# Patient Record
Sex: Male | Born: 1985 | Race: White | Hispanic: Yes | Marital: Single | Smoking: Never smoker
Health system: Southern US, Community
[De-identification: ages and names within clinical notes are randomized; demographics above are authoritative.]

---

## 2013-08-15 ENCOUNTER — Encounter (HOSPITAL_BASED_OUTPATIENT_CLINIC_OR_DEPARTMENT_OTHER): Payer: Self-pay | Admitting: Emergency Medicine

## 2013-08-15 ENCOUNTER — Emergency Department (HOSPITAL_BASED_OUTPATIENT_CLINIC_OR_DEPARTMENT_OTHER)
Admission: EM | Admit: 2013-08-15 | Discharge: 2013-08-15 | Disposition: A | Discharge status: Home / Self Care | Payer: BC Managed Care – PPO | Attending: Emergency Medicine | Admitting: Emergency Medicine

## 2013-08-15 DIAGNOSIS — Y939 Activity, unspecified: Secondary | ICD-10-CM | POA: Insufficient documentation

## 2013-08-15 DIAGNOSIS — W57XXXA Bitten or stung by nonvenomous insect and other nonvenomous arthropods, initial encounter: Secondary | ICD-10-CM | POA: Insufficient documentation

## 2013-08-15 DIAGNOSIS — S40269A Insect bite (nonvenomous) of unspecified shoulder, initial encounter: Secondary | ICD-10-CM | POA: Insufficient documentation

## 2013-08-15 DIAGNOSIS — S90569A Insect bite (nonvenomous), unspecified ankle, initial encounter: Secondary | ICD-10-CM | POA: Insufficient documentation

## 2013-08-15 DIAGNOSIS — Y929 Unspecified place or not applicable: Secondary | ICD-10-CM | POA: Insufficient documentation

## 2013-08-15 MED ORDER — PREDNISONE 10 MG PO TABS: ORAL_TABLET | ORAL | Status: AC

## 2013-08-15 NOTE — ED Provider Notes (Signed)
CSN: [id number]     Arrival date & time 08/15/13  2118 History   First MD Initiated Contact with Patient 08/15/13 2154     Chief Complaint  Patient presents with  . Rash     (Consider location/radiation/quality/duration/timing/severity/associated sxs/prior Treatment) Patient is a 28 y.o. male presenting with rash. The history is provided by the patient. No language interpreter was used.  Rash Location:  Shoulder/arm and leg Shoulder/arm rash location:  L arm and R arm Leg rash location:  L leg and R leg Quality: painful, redness and swelling   Pain details:    Quality:  Aching   Severity:  Moderate   Timing:  Constant   Progression:  Worsening Severity:  Mild Progression:  Worsening Chronicity:  New Relieved by:  Nothing Worsened by:  Nothing tried Ineffective treatments:  None tried   History reviewed. No pertinent past medical history. History reviewed. No pertinent past surgical history. No family history on file. History  Substance Use Topics  . Smoking status: Never Smoker   . Smokeless tobacco: Not on file  . Alcohol Use: Yes    Review of Systems  Skin: Positive for rash.  All other systems reviewed and are negative.     Allergies  Review of patient's allergies indicates no known allergies.  Home Medications  No current outpatient prescriptions on file. BP 128/81  Pulse 60  Temp(Src) 98.1 F (36.7 C) (Oral)  Resp 18  Ht 5\' 7"  (1.702 m)  Wt 191 lb (86.637 kg)  BMI 29.91 kg/m2  SpO2 99% Physical Exam  Nursing note and vitals reviewed. Constitutional: He is oriented to person, place, and time. He appears well-developed and well-nourished.  HENT:  Head: Normocephalic.  Right Ear: External ear normal.  Left Ear: External ear normal.  Eyes: Pupils are equal, round, and reactive to light.  Neck: Normal range of motion. Neck supple.  Cardiovascular: Normal rate.   Pulmonary/Chest: Effort normal.  Abdominal: Soft.  Musculoskeletal: Normal range of  motion.  Neurological: He is alert and oriented to person, place, and time. He has normal reflexes.  Skin: There is erythema.  Multiple erythematous bites,  Arms and legs,  Psychiatric: He has a normal mood and affect.    ED Course  Procedures (including critical care time) Labs Review Labs Reviewed - No data to display Imaging Review No results found.   EKG Interpretation None      MDM   Final diagnoses:  Insect bites    Prednisone taper    Elson AreasLeslie K Sofia, PA-C 08/15/13 2205

## 2013-08-15 NOTE — ED Notes (Signed)
Rash on his arms face and legs since spending the weekend at the beach.

## 2013-08-15 NOTE — ED Provider Notes (Signed)
Medical screening examination/treatment/procedure(s) were performed by non-physician practitioner and as supervising physician I was immediately available for consultation/collaboration.   EKG Interpretation None        William Jance Siek, MD 08/15/13 2254 

## 2013-08-15 NOTE — Discharge Instructions (Signed)
Bedbugs °Bedbugs are tiny bugs that live in and around beds. During the day, they hide in mattresses and other places near beds. They come out at night and bite people lying in bed. They need blood to live and grow. Bedbugs can be found in beds anywhere. Usually, they are found in places where many people come and go (hotels, shelters, hospitals). It does not matter whether the place is dirty or clean. °Getting bitten by bedbugs rarely causes a medical problem. The biggest problem can be getting rid of them.  This often takes the work of a pest control expert. °CAUSES °· Less use of pesticides. Bedbugs were common before the 1950s. Then, strong pesticides such as DDT nearly wiped them out. Today, these pesticides are not used because they harm the environment and can cause health problems. °· More travel. Besides mattresses, bedbugs can also live in clothing and luggage. They can come along as people travel from place to place. Bedbugs are more common in certain parts of the world. When people travel to those areas, the bugs can come home with them. °· Presence of birds and bats. Bedbugs often infest birds and bats. If you have these animals in or near your home, bedbugs may infest your house, too. °SYMPTOMS °It does not hurt to be bitten by a bedbug. You will probably not wake up when you are bitten. Bedbugs usually bite areas of the skin that are not covered. Symptoms may show when you wake up, or they may take a day or more to show up. Symptoms may include: °· Small red bumps on the skin. These might be lined up in a row or clustered in a group. °· A darker red dot in the middle of red bumps. °· Blisters on the skin. There may be swelling and very bad itching. These may be signs of an allergic reaction. This does not happen often. °DIAGNOSIS °Bedbug bites might look and feel like other types of insect bites. The bugs do not stay on the body like ticks or lice. They bite, drop off, and crawl away to hide. Your  caregiver will probably: °· Ask about your symptoms. °· Ask about your recent activities and travel. °· Check your skin for bedbug bites. °· Ask you to check at home for signs of bedbugs. You should look for: °· Spots or stains on the bed or nearby. This could be from bedbugs that were crushed or from their eggs or waste. °· Bedbugs themselves. They are reddish-brown, oval, and flat. They do not fly. They are about the size of an apple seed. °· Places to look for bedbugs include: °· Beds. Check mattresses, headboards, box springs, and bed frames. °· On drapes and curtains near the bed. °· Under carpeting in the bedroom. °· Behind electrical outlets. °· Behind any wallpaper that is peeling. °· Inside luggage. °TREATMENT °Most bedbug bites do not need treatment. They usually go away on their own in a few days. The bites are not dangerous. However, treatment may be needed if you have scratched so much that your skin has become infected. You may also need treatment if you are allergic to bedbug bites. Treatment options include: °· A drug that stops swelling and itching (corticosteroid). Usually, a cream is rubbed on the skin. If you have a bad rash, you may be given a corticosteroid pill. °· Oral antihistamines. These are pills to help control itching. °· Antibiotic medicines. An antibiotic may be prescribed for infected skin. °HOME CARE INSTRUCTIONS  °·   Take any medicine prescribed by your caregiver for your bites. Follow the directions carefully. °· Consider wearing pajamas with long sleeves and pant legs. °· Your bedroom may need to be treated. A pest control expert should make sure the bedbugs are gone. You may need to throw away mattresses or luggage. Ask the pest control expert what you can do to keep the bedbugs from coming back. Common suggestions include: °· Putting a plastic cover over your mattress. °· Washing and drying your clothes and bedding in hot water and a hot dryer. The temperature should be hotter  than 120° F (48.9° C). Bedbugs are killed by high temperatures. °· Vacuuming carefully all around your bed. Vacuum in all cracks and crevices where the bugs might hide. Do this often. °· Carefully checking all used furniture, bedding, or clothes that you bring into your house. °· Eliminating bird nests and bat roosts. °· If you get bedbug bites when traveling, check all your possessions carefully before bringing them into your house. If you find any bugs on clothes or in your luggage, consider throwing those items away. °SEEK MEDICAL CARE IF: °· You have red bug bites that keep coming back. °· You have red bug bites that itch badly. °· You have bug bites that cause a skin rash. °· You have scratch marks that are red and sore. °SEEK IMMEDIATE MEDICAL CARE IF: °You have a fever. °Document Released: 05/28/2010 Document Revised: 07/18/2011 Document Reviewed: 05/28/2010 °ExitCare® Patient Information ©2014 [employer]. ° °Insect Bite °Mosquitoes, flies, fleas, bedbugs, and many other insects can bite. Insect bites are different from insect stings. A sting is when venom is injected into the skin. Some insect bites can transmit infectious diseases. °SYMPTOMS  °Insect bites usually turn red, swell, and itch for 2 to 4 days. They often go away on their own. °TREATMENT  °Your caregiver may prescribe antibiotic medicines if a bacterial infection develops in the bite. °HOME CARE INSTRUCTIONS °· Do not scratch the bite area. °· Keep the bite area clean and dry. Wash the bite area thoroughly with soap and water. °· Put ice or cool compresses on the bite area. °· Put ice in a plastic bag. °· Place a towel between your skin and the bag. °· Leave the ice on for 20 minutes, 4 times a day for the first 2 to 3 days, or as directed. °· You may apply a baking soda paste, cortisone cream, or calamine lotion to the bite area as directed by your caregiver. This can help reduce itching and swelling. °· Only take over-the-counter or  prescription medicines as directed by your caregiver. °· If you are given antibiotics, take them as directed. Finish them even if you start to feel better. °You may need a tetanus shot if: °· You cannot remember when you had your last tetanus shot. °· You have never had a tetanus shot. °· The injury broke your skin. °If you get a tetanus shot, your arm may swell, get red, and feel warm to the touch. This is common and not a problem. If you need a tetanus shot and you choose not to have one, there is a rare chance of getting tetanus. Sickness from tetanus can be serious. °SEEK IMMEDIATE MEDICAL CARE IF:  °· You have increased pain, redness, or swelling in the bite area. °· You see a red line on the skin coming from the bite. °· You have a fever. °· You have joint pain. °· You have a headache or neck   pain. °· You have unusual weakness. °· You have a rash. °· You have chest pain or shortness of breath. °· You have abdominal pain, nausea, or vomiting. °· You feel unusually tired or sleepy. °MAKE SURE YOU:  °· Understand these instructions. °· Will watch your condition. °· Will get help right away if you are not doing well or get worse. °Document Released: 06/02/2004 Document Revised: 07/18/2011 Document Reviewed: 11/24/2010 °ExitCare® Patient Information ©2014 [employer]. ° °

## 2014-06-27 ENCOUNTER — Emergency Department (HOSPITAL_COMMUNITY)
Admission: EM | Admit: 2014-06-27 | Discharge: 2014-06-27 | Disposition: A | Discharge status: Home / Self Care | Payer: 59 | Attending: Emergency Medicine | Admitting: Emergency Medicine

## 2014-06-27 ENCOUNTER — Emergency Department (HOSPITAL_COMMUNITY): Payer: 59

## 2014-06-27 ENCOUNTER — Encounter (HOSPITAL_COMMUNITY): Payer: Self-pay | Admitting: Emergency Medicine

## 2014-06-27 DIAGNOSIS — F10129 Alcohol abuse with intoxication, unspecified: Secondary | ICD-10-CM | POA: Diagnosis present

## 2014-06-27 DIAGNOSIS — Y907 Blood alcohol level of 200-239 mg/100 ml: Secondary | ICD-10-CM | POA: Insufficient documentation

## 2014-06-27 DIAGNOSIS — F10120 Alcohol abuse with intoxication, uncomplicated: Secondary | ICD-10-CM | POA: Insufficient documentation

## 2014-06-27 DIAGNOSIS — Z79899 Other long term (current) drug therapy: Secondary | ICD-10-CM | POA: Diagnosis not present

## 2014-06-27 DIAGNOSIS — F10920 Alcohol use, unspecified with intoxication, uncomplicated: Secondary | ICD-10-CM

## 2014-06-27 LAB — CBG MONITORING, ED: Glucose-Capillary: 181 mg/dL — ABNORMAL HIGH (ref 70–99)

## 2014-06-27 LAB — RAPID URINE DRUG SCREEN, HOSP PERFORMED
Amphetamines: NOT DETECTED
BARBITURATES: NOT DETECTED
Benzodiazepines: NOT DETECTED
Cocaine: NOT DETECTED
Opiates: NOT DETECTED
Tetrahydrocannabinol: NOT DETECTED

## 2014-06-27 LAB — CBC WITH DIFFERENTIAL/PLATELET
BASOS PCT: 0 % (ref 0–1)
Basophils Absolute: 0 10*3/uL (ref 0.0–0.1)
EOS ABS: 0 10*3/uL (ref 0.0–0.7)
EOS PCT: 0 % (ref 0–5)
HCT: 41.3 % (ref 39.0–52.0)
Hemoglobin: 14.5 g/dL (ref 13.0–17.0)
LYMPHS ABS: 2.7 10*3/uL (ref 0.7–4.0)
Lymphocytes Relative: 43 % (ref 12–46)
MCH: 31.3 pg (ref 26.0–34.0)
MCHC: 35.1 g/dL (ref 30.0–36.0)
MCV: 89.2 fL (ref 78.0–100.0)
Monocytes Absolute: 0.4 10*3/uL (ref 0.1–1.0)
Monocytes Relative: 6 % (ref 3–12)
NEUTROS PCT: 51 % (ref 43–77)
Neutro Abs: 3.3 10*3/uL (ref 1.7–7.7)
PLATELETS: 202 10*3/uL (ref 150–400)
RBC: 4.63 MIL/uL (ref 4.22–5.81)
RDW: 12.6 % (ref 11.5–15.5)
WBC: 6.3 10*3/uL (ref 4.0–10.5)

## 2014-06-27 LAB — I-STAT CHEM 8, ED
BUN: 15 mg/dL (ref 6–23)
Calcium, Ion: 1.14 mmol/L (ref 1.12–1.23)
Chloride: 99 mmol/L (ref 96–112)
Creatinine, Ser: 1.1 mg/dL (ref 0.50–1.35)
Glucose, Bld: 187 mg/dL — ABNORMAL HIGH (ref 70–99)
HCT: 46 % (ref 39.0–52.0)
HEMOGLOBIN: 15.6 g/dL (ref 13.0–17.0)
POTASSIUM: 3.2 mmol/L — AB (ref 3.5–5.1)
SODIUM: 140 mmol/L (ref 135–145)
TCO2: 20 mmol/L (ref 0–100)

## 2014-06-27 LAB — ETHANOL: Alcohol, Ethyl (B): 212 mg/dL — ABNORMAL HIGH (ref 0–9)

## 2014-06-27 LAB — SALICYLATE LEVEL: Salicylate Lvl: <4 mg/dL (ref 2.8–20.0)

## 2014-06-27 LAB — ACETAMINOPHEN LEVEL

## 2014-06-27 MED ORDER — SODIUM CHLORIDE 0.9 % IV BOLUS (SEPSIS)
500.0000 mL | Freq: Once | INTRAVENOUS | Status: AC
  Administered 2014-06-27: 500 mL via INTRAVENOUS

## 2014-06-27 MED ORDER — ONDANSETRON HCL 4 MG/2ML IJ SOLN
4.0000 mg | Freq: Once | INTRAMUSCULAR | Status: AC
Start: 2014-06-27 — End: 2014-06-27
  Administered 2014-06-27: 4 mg via INTRAVENOUS
  Filled 2014-06-27: qty 2

## 2014-06-27 MED ORDER — SODIUM CHLORIDE 0.9 % IV SOLN
Freq: Once | INTRAVENOUS | Status: AC
  Administered 2014-06-27: 05:00:00 via INTRAVENOUS

## 2014-06-27 MED ORDER — ETOMIDATE 2 MG/ML IV SOLN
INTRAVENOUS | Status: AC
  Filled 2014-06-27: qty 20

## 2014-06-27 MED ORDER — SUCCINYLCHOLINE CHLORIDE 20 MG/ML IJ SOLN
INTRAMUSCULAR | Status: AC
  Filled 2014-06-27: qty 1

## 2014-06-27 MED ORDER — SODIUM CHLORIDE 0.9 % IV BOLUS (SEPSIS)
1000.0000 mL | Freq: Once | INTRAVENOUS | Status: AC
  Administered 2014-06-27: 1000 mL via INTRAVENOUS

## 2014-06-27 MED ORDER — ONDANSETRON 8 MG PO TBDP: ORAL_TABLET | ORAL | Status: AC

## 2014-06-27 MED ORDER — ROCURONIUM BROMIDE 50 MG/5ML IV SOLN
INTRAVENOUS | Status: AC
  Filled 2014-06-27: qty 2

## 2014-06-27 MED ORDER — NALOXONE HCL 0.4 MG/ML IJ SOLN
0.4000 mg | Freq: Once | INTRAMUSCULAR | Status: AC
  Administered 2014-06-27: 0.4 mg via INTRAVENOUS
  Filled 2014-06-27: qty 1

## 2014-06-27 MED ORDER — LIDOCAINE HCL (CARDIAC) 20 MG/ML IV SOLN
INTRAVENOUS | Status: AC
  Filled 2014-06-27: qty 5

## 2014-06-27 NOTE — ED Notes (Signed)
Family at bedside to get all belongings.

## 2014-06-27 NOTE — ED Provider Notes (Signed)
Pt is clinically sober now.  Dc home. Calling for ride. Vitals normal  Chad CoKevin M Shalana Jardin, MD 06/27/14 [phone]

## 2014-06-27 NOTE — ED Notes (Addendum)
Pt a&ox4. Answering all questions appropriately.  Respirations even and unlabored, bilateral symmetrical rise and fall of chest. Skin warm and dry. In no acute distress. Denies needs.    On the phone to call for a ride.

## 2014-06-27 NOTE — ED Provider Notes (Signed)
CSN: [id number]     Arrival date & time 06/27/14  0404 History   First MD Initiated Contact with Patient 06/27/14 [phone]     Chief Complaint  Patient presents with  . Alcohol Intoxication     (Consider location/radiation/quality/duration/timing/severity/associated sxs/prior Treatment) Patient is a 29 y.o. male presenting with intoxication. The history is provided by the police. The history is limited by the condition of the patient.  Alcohol Intoxication This is a new problem. Episode onset: unknown. The problem occurs constantly. The problem has not changed since onset.Nothing aggravates the symptoms. Nothing relieves the symptoms. He has tried nothing for the symptoms. The treatment provided no relief.    History reviewed. No pertinent past medical history. History reviewed. No pertinent past surgical history. History reviewed. No pertinent family history. History  Substance Use Topics  . Smoking status: Never Smoker   . Smokeless tobacco: Not on file  . Alcohol Use: Yes    Review of Systems  Unable to perform ROS     Allergies  Review of patient's allergies indicates no known allergies.  Home Medications   Prior to Admission medications   Medication Sig Start Date End Date Taking? Authorizing Provider  Multiple Vitamin (MULTIVITAMIN WITH MINERALS) TABS tablet Take 1 tablet by mouth daily.   Yes Historical Provider, MD  predniSONE (DELTASONE) 10 MG tablet 6,5,4,3,2,1 taper Patient not taking: Reported on 06/27/2014 08/15/13   Lonia Skinner Sofia, PA-C   BP 107/66 mmHg  Pulse 78  Temp(Src) 97.5 F (36.4 C) (Oral)  Resp 14  SpO2 100% Physical Exam  Constitutional: He appears well-developed and well-nourished.  HENT:  Head: Normocephalic and atraumatic.  Mouth/Throat: Oropharynx is clear and moist.  Eyes: Conjunctivae and EOM are normal. Pupils are equal, round, and reactive to light.  Neck: Normal range of motion. Neck supple.  Cardiovascular: Normal rate, regular rhythm  and intact distal pulses.   Pulmonary/Chest: Effort normal and breath sounds normal. No respiratory distress. He has no wheezes. He has no rales.  Abdominal: Soft. Bowel sounds are normal. There is no tenderness. There is no rebound and no guarding.  Musculoskeletal: Normal range of motion.  Neurological: He has normal reflexes.  Now AO3  Skin: Skin is warm and dry. He is not diaphoretic.    ED Course  Procedures (including critical care time) Labs Review Labs Reviewed  ETHANOL - Abnormal; Notable for the following:    Alcohol, Ethyl (B) 212 (*)    All other components within normal limits  ACETAMINOPHEN LEVEL - Abnormal; Notable for the following:    Acetaminophen (Tylenol), Serum <10.0 (*)    All other components within normal limits  I-STAT CHEM 8, ED - Abnormal; Notable for the following:    Potassium 3.2 (*)    Glucose, Bld 187 (*)    All other components within normal limits  CBG MONITORING, ED - Abnormal; Notable for the following:    Glucose-Capillary 181 (*)    All other components within normal limits  CBC WITH DIFFERENTIAL/PLATELET  SALICYLATE LEVEL  URINE RAPID DRUG SCREEN (HOSP PERFORMED)    Imaging Review Dg Chest Portable 1 View  06/27/2014   CLINICAL DATA:  Alcohol intoxication  EXAM: PORTABLE CHEST - 1 VIEW  COMPARISON:  None.  FINDINGS: The heart size and mediastinal contours are within normal limits. Both lungs are clear. The visualized skeletal structures are unremarkable.  IMPRESSION: No active disease.   Electronically Signed   By: Ellery Plunk M.D.   On: 06/27/2014 04:55  EKG Interpretation   Date/Time:  Friday June 27 2014 04:31:41 EST Ventricular Rate:  81 PR Interval:  162 QRS Duration: 106 QT Interval:  380 QTC Calculation: 441 R Axis:   52 Text Interpretation:  Sinus rhythm Confirmed by [facility]  [facility], Kareemah Grounds  ([id number]) on 06/27/2014 5:19:42 AM      MDM   Final diagnoses:  None    Medications  ondansetron (ZOFRAN)  injection 4 mg (not administered)  0.9 %  sodium chloride infusion ( Intravenous Stopped 06/27/14 0617)  naloxone [facility](NARCAN) injection 0.4 mg (0.4 mg Intravenous Given 06/27/14 0522)  sodium chloride 0.9 % bolus 500 mL (0 mLs Intravenous Stopped 06/27/14 0719)    Sober up.  Awake but not yet tolerating POs will need to PO challenge and ambulate prior to d/c.      Jasmine AweApril K Averee Harb-Rasch, MD 06/27/14 [phone]

## 2014-06-27 NOTE — Discharge Instructions (Signed)
Alcohol Intoxication °Alcohol intoxication occurs when you drink enough alcohol that it affects your ability to function. It can be mild or very severe. Drinking a lot of alcohol in a short time is called binge drinking. This can be very harmful. Drinking alcohol can also be more dangerous if you are taking medicines or other drugs. Some of the effects caused by alcohol may include: °· Loss of coordination. °· Changes in mood and behavior. °· Unclear thinking. °· Trouble talking (slurred speech). °· Throwing up (vomiting). °· Confusion. °· Slowed breathing. °· Twitching and shaking (seizures). °· Loss of consciousness. °HOME CARE °· Do not drive after drinking alcohol. °· Drink enough water and fluids to keep your pee (urine) clear or pale yellow. Avoid caffeine. °· Only take medicine as told by your doctor. °GET HELP IF: °· You throw up (vomit) many times. °· You do not feel better after a few days. °· You frequently have alcohol intoxication. Your doctor can help decide if you should see a substance use treatment counselor. °GET HELP RIGHT AWAY IF: °· You become shaky when you stop drinking. °· You have twitching and shaking. °· You throw up blood. It may look bright red or like coffee grounds. °· You notice blood in your poop (bowel movements). °· You become lightheaded or pass out (faint). °MAKE SURE YOU:  °· Understand these instructions. °· Will watch your condition. °· Will get help right away if you are not doing well or get worse. °Document Released: 10/12/2007 Document Revised: 12/26/2012 Document Reviewed: 09/28/2012 °ExitCare® Patient Information ©2015 [employer]. This information is not intended to replace advice given to you by your health care provider. Make sure you discuss any questions you have with your health care provider. ° °

## 2014-06-27 NOTE — ED Notes (Signed)
Pt tolerated PO fluids

## 2014-06-27 NOTE — ED Notes (Signed)
Pt. Moved to Res B, unable to obtain information .

## 2014-06-27 NOTE — ED Notes (Signed)
Pt. On monitor. 
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
## 2014-06-27 NOTE — ED Notes (Signed)
MD at bedside. 

## 2016-02-23 IMAGING — CT CT HEAD W/O CM
2 series · 17 of 30 positions shown, 20 images · non-contrast
Comparison: None.

CLINICAL DATA: Alcohol intoxication

EXAM:
CT HEAD WITHOUT CONTRAST
TECHNIQUE: Contiguous axial images were obtained from the base of the skull
through the vertex without intravenous contrast.

[Series 2: head w/o · axial · non-contrast · 0.45mm/px · z∈[-108,+12]mm · 9 of 31 slices shown, 12 images]
[im 4/31  brain]
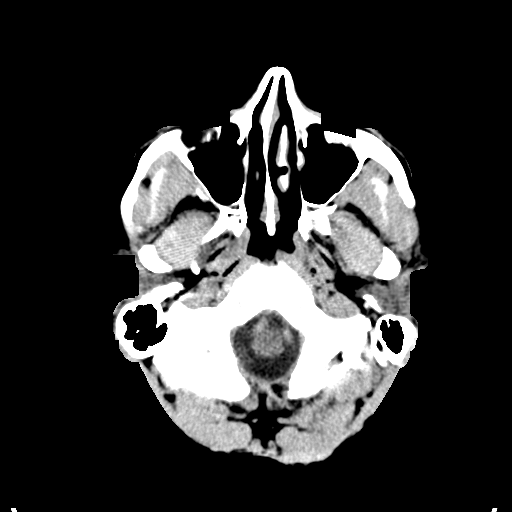
[im 4/31  bone]
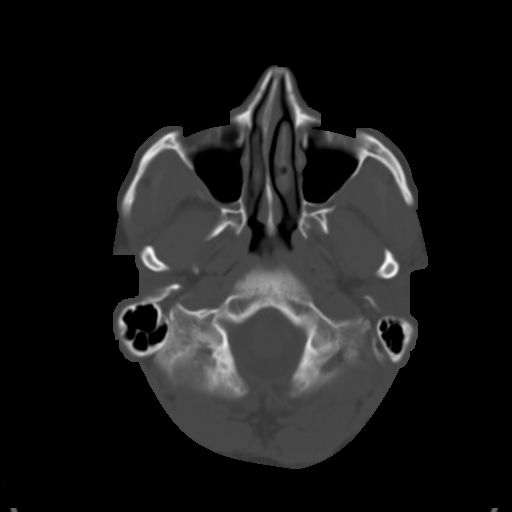
[im 7/31  brain]
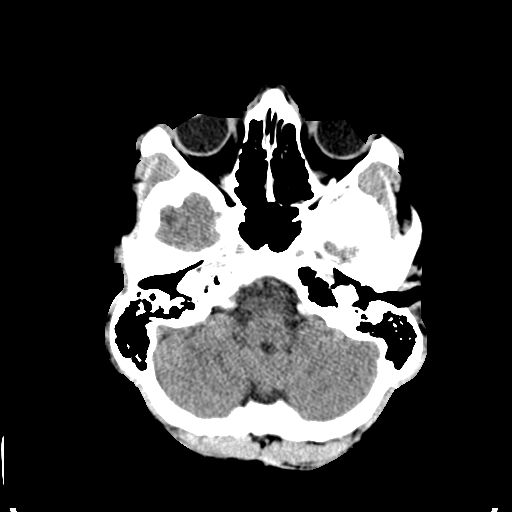
[im 10/31  brain]
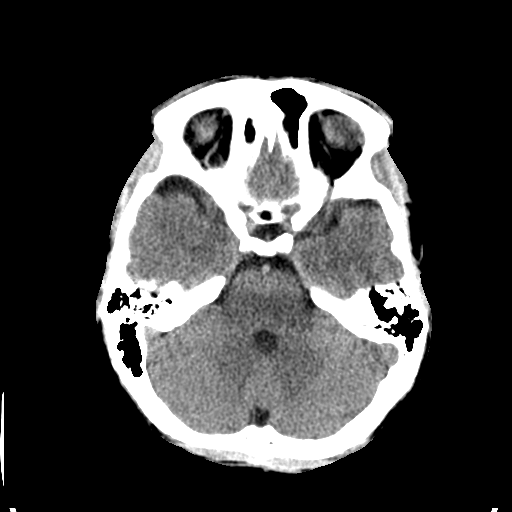
[im 13/31  brain]
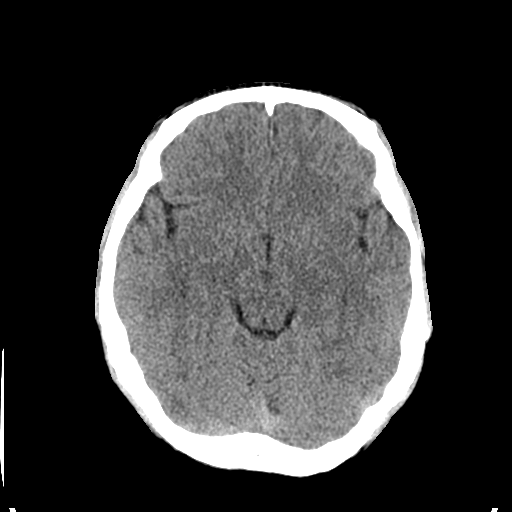
[im 16/31  brain]
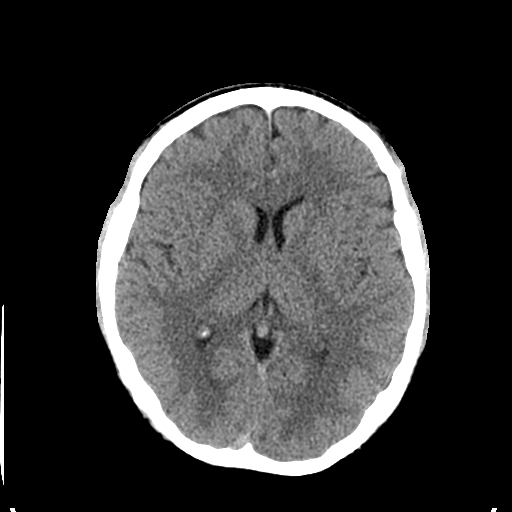
[im 16/31  bone]
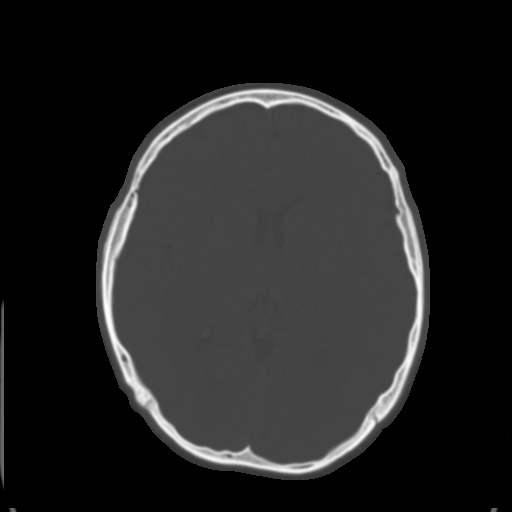
[im 19/31  brain]
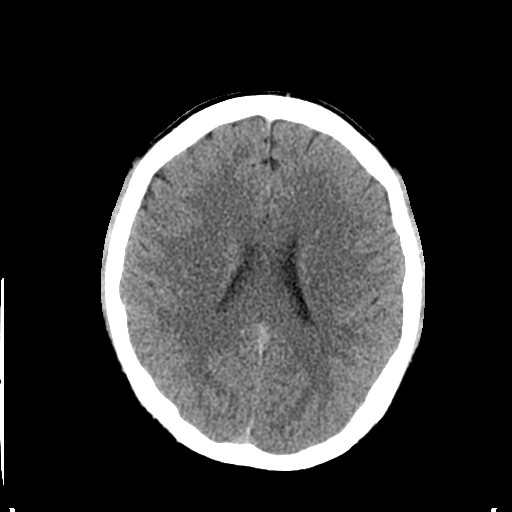
[im 22/31  brain]
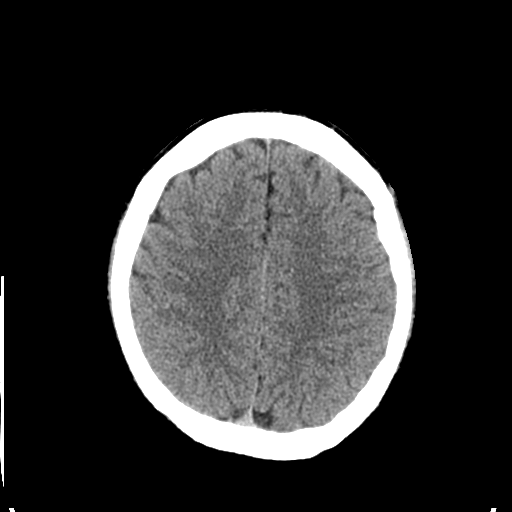
[im 25/31  brain]
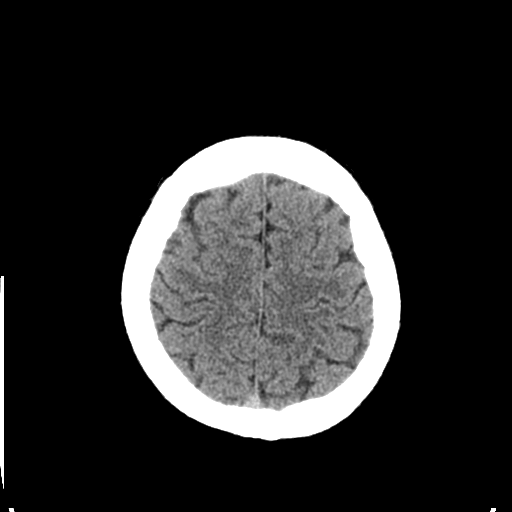
[im 28/31  brain]
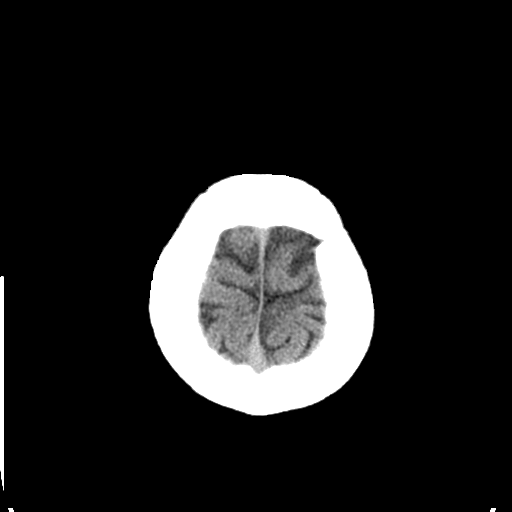
[im 28/31  bone]
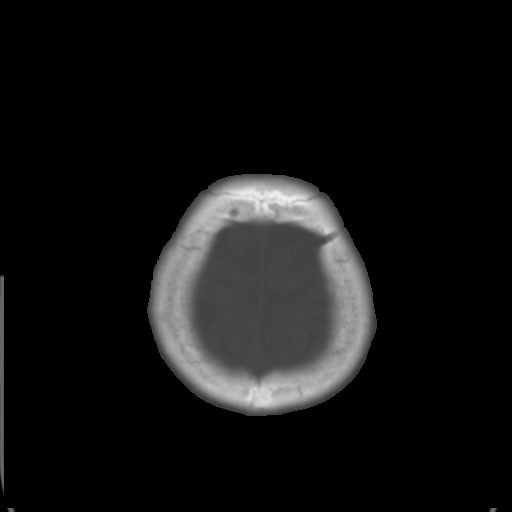

[Series 3: bone windows · axial · 0.45mm/px · z∈[-108,+12]mm · 8 of 52 slices shown]
[im 6/52  bone]
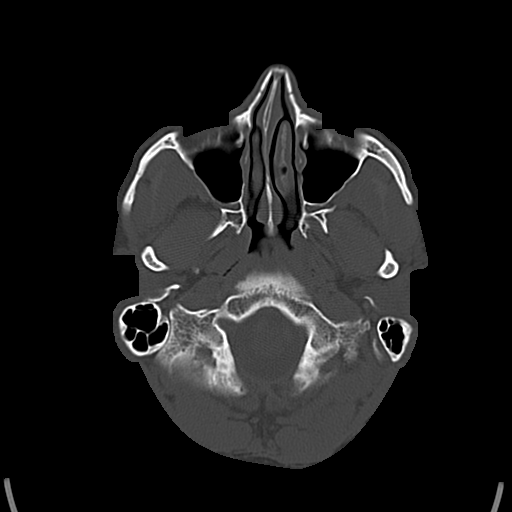
[im 12/52  bone]
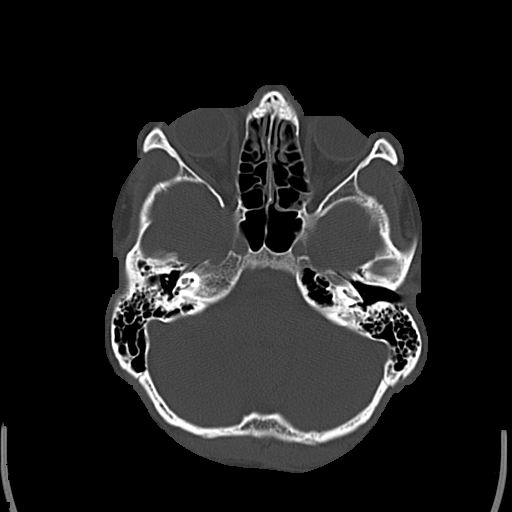
[im 18/52  bone]
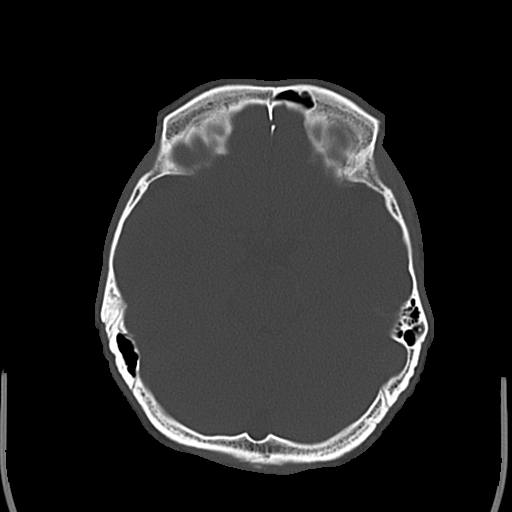
[im 23/52  bone]
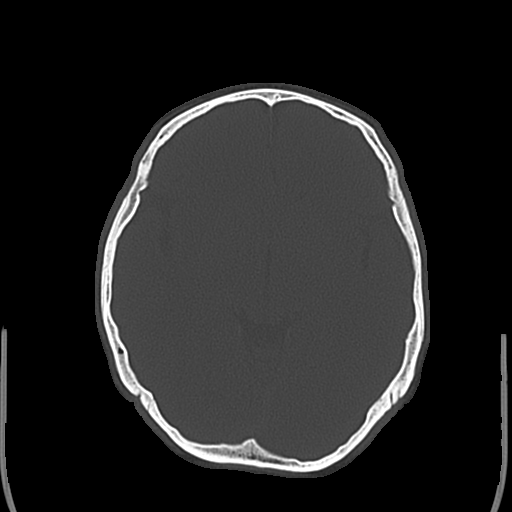
[im 29/52  bone]
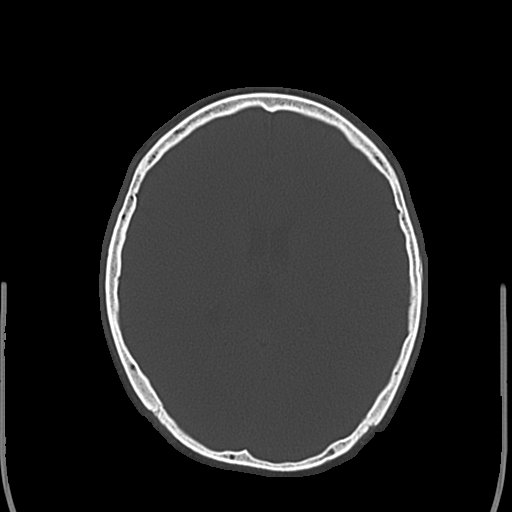
[im 35/52  bone]
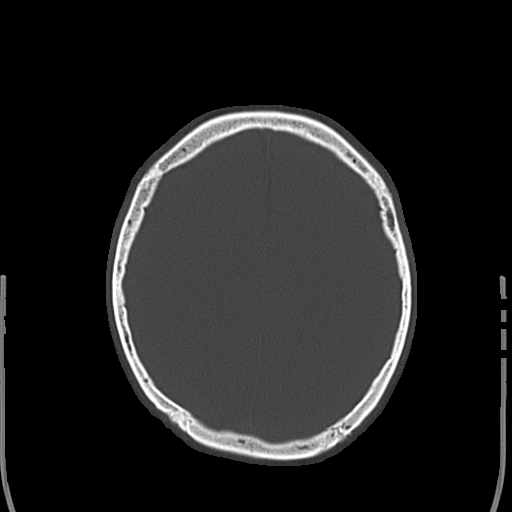
[im 40/52  bone]
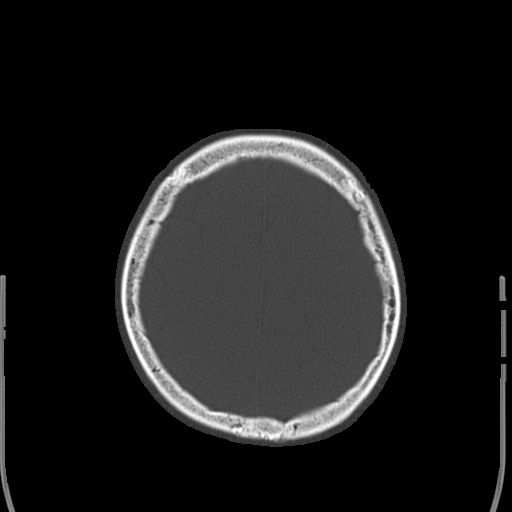
[im 46/52  bone]
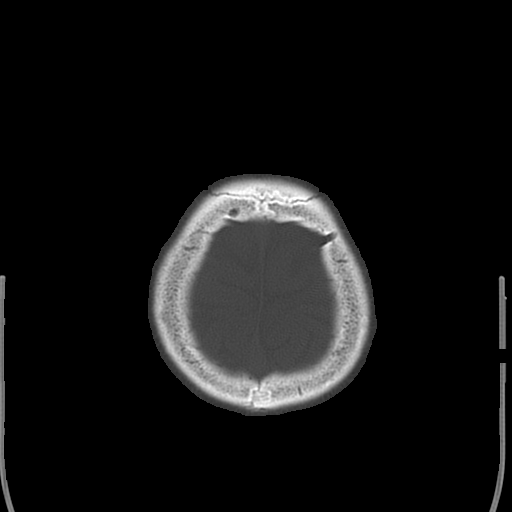

[17 of 30 positions shown; findings below may reference images not displayed]

FINDINGS: There is no intracranial hemorrhage, mass or evidence of acute
infarction. The brain and CSF spaces appear unremarkable.

The bony structures are intact. The visible portions of the
paranasal sinuses are clear.
IMPRESSION: Normal brain

## 2019-11-21 ENCOUNTER — Other Ambulatory Visit: Payer: Self-pay

## 2019-11-21 ENCOUNTER — Emergency Department (HOSPITAL_COMMUNITY)
Admission: EM | Admit: 2019-11-21 | Discharge: 2019-11-21 | Disposition: A | Discharge status: Home / Self Care | Payer: BLUE CROSS/BLUE SHIELD | Attending: Emergency Medicine | Admitting: Emergency Medicine

## 2019-11-21 DIAGNOSIS — Y929 Unspecified place or not applicable: Secondary | ICD-10-CM | POA: Insufficient documentation

## 2019-11-21 DIAGNOSIS — S0181XA Laceration without foreign body of other part of head, initial encounter: Secondary | ICD-10-CM | POA: Insufficient documentation

## 2019-11-21 DIAGNOSIS — Y939 Activity, unspecified: Secondary | ICD-10-CM | POA: Insufficient documentation

## 2019-11-21 DIAGNOSIS — Z23 Encounter for immunization: Secondary | ICD-10-CM | POA: Insufficient documentation

## 2019-11-21 DIAGNOSIS — Y999 Unspecified external cause status: Secondary | ICD-10-CM | POA: Insufficient documentation

## 2019-11-21 DIAGNOSIS — W208XXA Other cause of strike by thrown, projected or falling object, initial encounter: Secondary | ICD-10-CM | POA: Diagnosis not present

## 2019-11-21 MED ORDER — TETANUS-DIPHTH-ACELL PERTUSSIS 5-2.5-18.5 LF-MCG/0.5 IM SUSP: 0.5000 mL | Freq: Once | INTRAMUSCULAR | Status: DC

## 2019-11-21 NOTE — Discharge Instructions (Addendum)
May put Neosporin or bacitracin on wound.  Given occurred 24 hours ago we do not put stitches in this.  May follow-up with plastic surgery once wound has healed for wound management
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
## 2019-11-21 NOTE — ED Provider Notes (Signed)
[facility]-EMERGENCY DEPT Provider Note   CSN: [id number] Arrival date & time: 11/21/19  1220    History Chief Complaint  Patient presents with  . Laceration    Chad Faulkner is a 34 y.o. male with medical history who presents for evaluation of laceration.  Patient with left forehead laceration obtained approximately 20 hours prior to arrival.  Denies LOC or anticoagulation.  No fever, chills,  headache, lightness, dizziness, neck pain, shortness of breath.  No bleeding or drainage.  He does have abrasion.  Denies additional aggravating or relieving factors.  Unknown last tetanus. Occurred when wood board fell onto head.  History pain from patient and past medical records.  No interpreter is used.   HPI     No past medical history on file.  There are no problems to display for this patient.   No past surgical history on file.     No family history on file.  Social History   Tobacco Use  . Smoking status: Never Smoker  Substance Use Topics  . Alcohol use: Yes  . Drug use: No    Home Medications Prior to Admission medications   Medication Sig Start Date End Date Taking? Authorizing Provider  Multiple Vitamin (MULTIVITAMIN WITH MINERALS) TABS tablet Take 1 tablet by mouth daily.    [provider]  ondansetron (ZOFRAN ODT) 8 MG disintegrating tablet 8mg  ODT q8 hours prn nausea 06/27/14   Palumbo, April, MD  predniSONE (DELTASONE) 10 MG tablet 6,5,4,3,2,1 taper Patient not taking: Reported on 06/27/2014 08/15/13   10/15/13, PA-C    Allergies    Patient has no known allergies.  Review of Systems   Review of Systems  Constitutional: Negative.   HENT: Negative.   Respiratory: Negative.   Cardiovascular: Negative.   Gastrointestinal: Negative.   Genitourinary: Negative.   Musculoskeletal: Negative.   Skin: Positive for wound.  Neurological: Negative.   All other systems reviewed and are negative.  Physical Exam Updated Vital  Signs BP 122/74 (BP Location: Left Arm)   Pulse 75   Temp 98.2 F (36.8 C) (Oral)   Resp 16   Ht 5\' 7"  (1.702 m)   Wt 83.9 kg   SpO2 98%   BMI 28.98 kg/m   Physical Exam Vitals and nursing note reviewed.  Constitutional:      General: He is not in acute distress.    Appearance: He is well-developed. He is not ill-appearing, toxic-appearing or diaphoretic.  HENT:     Head: Normocephalic.     Jaw: There is normal jaw occlusion.      Comments: 1 cm superficial laceration to left forehead. Appear to have skin tear. No bleeding or drainage.    Nose: Nose normal.     Mouth/Throat:     Mouth: Mucous membranes are moist.  Eyes:     Extraocular Movements: Extraocular movements intact.     Conjunctiva/sclera: Conjunctivae normal.     Pupils: Pupils are equal, round, and reactive to light.     Visual Fields: Right eye visual fields normal and left eye visual fields normal.     Comments: No nystagmus  Cardiovascular:     Rate and Rhythm: Normal rate and regular rhythm.     Pulses: Normal pulses.     Heart sounds: Normal heart sounds.  Pulmonary:     Effort: Pulmonary effort is normal. No respiratory distress.     Breath sounds: Normal breath sounds.  Abdominal:     General: Bowel  sounds are normal. There is no distension.     Palpations: Abdomen is soft.  Musculoskeletal:        General: Normal range of motion.     Cervical back: Normal range of motion and neck supple.  Skin:    General: Skin is warm and dry.     Capillary Refill: Capillary refill takes less than 2 seconds.     Comments: Skin tear vs superficial laceration to left forehead. No edema, erythema, warmth.  Neurological:     General: No focal deficit present.     Mental Status: He is alert.     Cranial Nerves: Cranial nerves are intact.     Sensory: Sensation is intact.     Motor: Motor function is intact.     Coordination: Coordination is intact.     Gait: Gait is intact.     Comments: Mental Status:  Alert,  oriented, thought content appropriate. Speech fluent without evidence of aphasia. Able to follow 2 step commands without difficulty.  Cranial Nerves:  II:  Peripheral visual fields grossly normal, pupils equal, round, reactive to light III,IV, VI: ptosis not present, extra-ocular motions intact bilaterally  V,VII: smile symmetric, facial light touch sensation equal VIII: hearing grossly normal bilaterally  IX,X: midline uvula rise  XI: bilateral shoulder shrug equal and strong XII: midline tongue extension  Motor:  5/5 in upper and lower extremities bilaterally including strong and equal grip strength and dorsiflexion/plantar flexion Sensory: Pinprick and light touch normal in all extremities.  Deep Tendon Reflexes: 2+ and symmetric  Cerebellar: normal finger-to-nose with bilateral upper extremities Gait: normal gait and balance CV: distal pulses palpable throughout     ED Results / Procedures / Treatments   Labs (all labs ordered are listed, but only abnormal results are displayed) Labs Reviewed - No data to display  EKG None  Radiology No results found.  Procedures Procedures (including critical care time)  Medications Ordered in ED Medications  Tdap (BOOSTRIX) injection 0.5 mL (has no administration in time range)    ED Course  I have reviewed the triage vital signs and the nursing notes.  Pertinent labs & imaging results that were available during my care of the patient were reviewed by me and considered in my medical decision making (see chart for details).  9 old male presents for evaluation of laceration to left forehead.  Occurred 20 hours ago.  Occurred when a wooden board fell.  Denies LOC, anticoagulation.  Wound superficial nature, approximately 1 cm.  No active bleeding or drainage.  He is neurovascularly intact.  Tetanus not up-to-date, will update.  Do not feel patient needs imaging at this time.  Unfortunately given timing of wound superficial nature do not  feel he needs closure.  We will have him place antibiotic ointment.  He may follow-up outpatient plastic surgery.  The patient has been appropriately medically screened and/or stabilized in the ED. I have low suspicion for any other emergent medical condition which would require further screening, evaluation or treatment in the ED or require inpatient management.  Patient is hemodynamically stable and in no acute distress.  Patient able to ambulate in department prior to ED.  Evaluation does not show acute pathology that would require ongoing or additional emergent interventions while in the emergency department or further inpatient treatment.  I have discussed the diagnosis with the patient and answered all questions.  Pain is been managed while in the emergency department and patient has no further complaints prior to  discharge.  Patient is comfortable with plan discussed in room and is stable for discharge at this time.  I have discussed strict return precautions for returning to the emergency department.  Patient was encouraged to follow-up with PCP/specialist refer to at discharge.    MDM Rules/Calculators/A&P                          Final Clinical Impression(s) / ED Diagnoses Final diagnoses:  Facial laceration, initial encounter    Rx / DC Orders ED Discharge Orders    None       Kelsey Durflinger A, PA-C 11/21/19 1545    Bethann Berkshire, MD 11/22/19 [phone]
